# Patient Record
Sex: Female | Born: 1990 | Hispanic: Yes | Marital: Single | Smoking: Never smoker
Health system: Southern US, Community
[De-identification: ages and names within clinical notes are randomized; demographics above are authoritative.]

---

## 2017-08-18 ENCOUNTER — Emergency Department (HOSPITAL_COMMUNITY)
Admission: EM | Admit: 2017-08-18 | Discharge: 2017-08-18 | Disposition: A | Discharge status: Home / Self Care | Payer: 59 | Attending: Emergency Medicine | Admitting: Emergency Medicine

## 2017-08-18 ENCOUNTER — Other Ambulatory Visit: Payer: Self-pay

## 2017-08-18 ENCOUNTER — Emergency Department (HOSPITAL_COMMUNITY): Payer: 59

## 2017-08-18 ENCOUNTER — Encounter (HOSPITAL_COMMUNITY): Payer: Self-pay

## 2017-08-18 DIAGNOSIS — J189 Pneumonia, unspecified organism: Secondary | ICD-10-CM

## 2017-08-18 DIAGNOSIS — I10 Essential (primary) hypertension: Secondary | ICD-10-CM | POA: Insufficient documentation

## 2017-08-18 DIAGNOSIS — R911 Solitary pulmonary nodule: Secondary | ICD-10-CM

## 2017-08-18 DIAGNOSIS — R1011 Right upper quadrant pain: Secondary | ICD-10-CM | POA: Diagnosis present

## 2017-08-18 LAB — CBC WITH DIFFERENTIAL/PLATELET
ABS IMMATURE GRANULOCYTES: 0 10*3/uL (ref 0.0–0.1)
BASOS ABS: 0 10*3/uL (ref 0.0–0.1)
BASOS PCT: 0 %
Eosinophils Absolute: 0.1 10*3/uL (ref 0.0–0.7)
Eosinophils Relative: 1 %
HCT: 34.2 % — ABNORMAL LOW (ref 36.0–46.0)
HEMOGLOBIN: 11.2 g/dL — AB (ref 12.0–15.0)
Immature Granulocytes: 0 %
LYMPHS PCT: 15 %
Lymphs Abs: 1.6 10*3/uL (ref 0.7–4.0)
MCH: 29.3 pg (ref 26.0–34.0)
MCHC: 32.7 g/dL (ref 30.0–36.0)
MCV: 89.5 fL (ref 78.0–100.0)
MONO ABS: 0.9 10*3/uL (ref 0.1–1.0)
MONOS PCT: 8 %
Neutro Abs: 8.1 10*3/uL — ABNORMAL HIGH (ref 1.7–7.7)
Neutrophils Relative %: 76 %
Platelets: 201 10*3/uL (ref 150–400)
RBC: 3.82 MIL/uL — ABNORMAL LOW (ref 3.87–5.11)
RDW: 11.8 % (ref 11.5–15.5)
WBC: 10.8 10*3/uL — ABNORMAL HIGH (ref 4.0–10.5)

## 2017-08-18 LAB — COMPREHENSIVE METABOLIC PANEL
ALT: 32 U/L (ref 14–54)
AST: 29 U/L (ref 15–41)
Albumin: 3.4 g/dL — ABNORMAL LOW (ref 3.5–5.0)
Alkaline Phosphatase: 73 U/L (ref 38–126)
Anion gap: 6 (ref 5–15)
BILIRUBIN TOTAL: 0.8 mg/dL (ref 0.3–1.2)
BUN: 38 mg/dL — AB (ref 6–20)
CO2: 25 mmol/L (ref 22–32)
Calcium: 8.9 mg/dL (ref 8.9–10.3)
Chloride: 109 mmol/L (ref 101–111)
Creatinine, Ser: 1.21 mg/dL — ABNORMAL HIGH (ref 0.44–1.00)
GFR calc Af Amer: >60 mL/min (ref >60)
GLUCOSE: 98 mg/dL (ref 65–99)
Potassium: 4.8 mmol/L (ref 3.5–5.1)
Sodium: 140 mmol/L (ref 135–145)
TOTAL PROTEIN: 7 g/dL (ref 6.5–8.1)

## 2017-08-18 LAB — LIPASE, BLOOD: LIPASE: 28 U/L (ref 11–51)

## 2017-08-18 LAB — POC URINE PREG, ED: Preg Test, Ur: NEGATIVE

## 2017-08-18 LAB — D-DIMER, QUANTITATIVE (NOT AT ARMC): D DIMER QUANT: 2.36 ug{FEU}/mL — AB (ref 0.00–0.50)

## 2017-08-18 MED ORDER — IOPAMIDOL (ISOVUE-370) INJECTION 76%
100.0000 mL | Freq: Once | INTRAVENOUS | Status: AC
Start: 2017-08-18 — End: 2017-08-18
  Administered 2017-08-18: 100 mL via INTRAVENOUS

## 2017-08-18 MED ORDER — IOPAMIDOL (ISOVUE-370) INJECTION 76%
INTRAVENOUS | Status: AC
  Filled 2017-08-18: qty 100

## 2017-08-18 MED ORDER — AZITHROMYCIN 250 MG PO TABS: ORAL_TABLET | ORAL | 0 refills | Status: AC

## 2017-08-18 NOTE — Discharge Instructions (Addendum)
Make sure that you are getting plenty of rest, drink a lot of fluids and eating 3 meals each day.  For fever, or pain, use Tylenol 650 mg, every 4 hours.  Stop taking the antibiotic prescription today.  The CT scan showed some pulmonary nodules which will require follow-up with a primary care doctor for a repeat CAT scan in 3 to 6 months.  Your blood pressure was elevated today and you need to see a primary care doctor about it in 2 to 4 weeks.  In the meantime stay on a low salt diet, and follow further dietary instructions included here.  Your creatinine level is slightly elevated possibly related to hypertension, or dehydration.  Make sure that you are drinking at least 1 L of water each day.  To improve your kidney function.  See the doctor of your choice if not better in 3 or 4 days.

## 2017-08-18 NOTE — ED Notes (Signed)
Patient transported to X-ray 

## 2017-08-18 NOTE — ED Notes (Signed)
Pt ambulated from waiting room to room. 

## 2017-08-18 NOTE — ED Notes (Signed)
Rt shoulder pain under rt breast pain x 2 days states hurts to takes a deep breath,  Has had a  Stomach ache

## 2017-08-18 NOTE — ED Provider Notes (Signed)
[facility] EMERGENCY DEPARTMENT Provider Note   CSN: [id number] Arrival date & time: 08/18/17  1320     History   Chief Complaint Chief Complaint  Patient presents with  . Shoulder Pain    HPI [facility] Lyfe Reihl is a 27 y.o. female.  HPI  27 year old female presents with chief complaint of right shoulder pain in addition to shortness of breath.  Shoulder pain started yesterday.  Shortness of breath has been for a couple days and seems to go along with the shoulder pain.  The shoulder pain is atraumatic and seems to come and go.  Went away yesterday after treatment and today took ibuprofen and it went away as well.  She currently has no pain.  She has been getting some pain under her right breast as well.  Some on and off headache.  No vomiting.   no leg swelling.  She recently drove to and from [place] about a month ago.  She is not on birth control pills and has never had a DVT.  History reviewed. No pertinent past medical history.  There are no active problems to display for this patient.   History reviewed. No pertinent surgical history.   OB History   None      Home Medications    Prior to Admission medications   Not on File    Family History No family history on file.  Social History Social History   Tobacco Use  . Smoking status: Never Smoker  . Smokeless tobacco: Never Used  Substance Use Topics  . Alcohol use: Yes  . Drug use: Never     Allergies   Patient has no known allergies.   Review of Systems Review of Systems  Constitutional: Negative for fever.  Respiratory: Positive for cough and shortness of breath.   Cardiovascular: Positive for chest pain.  Gastrointestinal: Positive for abdominal pain and diarrhea. Negative for vomiting.  Genitourinary: Negative for dysuria.  Musculoskeletal: Positive for arthralgias.  Neurological: Negative for weakness and numbness.  All other systems reviewed and are  negative.    Physical Exam Updated Vital Signs BP (!) 150/66   Pulse 77   Temp 98.2 F (36.8 C) (Oral)   Resp 20   LMP 07/26/2017   SpO2 99%   Physical Exam  Constitutional: She is oriented to person, place, and time. She appears well-developed and well-nourished. No distress.  HENT:  Head: Normocephalic and atraumatic.  Right Ear: External ear normal.  Left Ear: External ear normal.  Nose: Nose normal.  Eyes: Right eye exhibits no discharge. Left eye exhibits no discharge.  Cardiovascular: Normal rate, regular rhythm and normal heart sounds.  Pulses:      Radial pulses are 2+ on the right side.  Pulmonary/Chest: Effort normal and breath sounds normal. She exhibits no tenderness.  Abdominal: Soft. There is tenderness in the right upper quadrant.  Musculoskeletal:       Right shoulder: She exhibits normal range of motion, no tenderness, no deformity and normal pulse.  Neurological: She is alert and oriented to person, place, and time.  Skin: Skin is warm and dry. She is not diaphoretic.  Nursing note and vitals reviewed.    ED Treatments / Results  Labs (all labs ordered are listed, but only abnormal results are displayed) Labs Reviewed  COMPREHENSIVE METABOLIC PANEL - Abnormal; Notable for the following components:      Result Value   BUN 38 (*)    Creatinine, Ser 1.21 (*)  Albumin 3.4 (*)    All other components within normal limits  CBC WITH DIFFERENTIAL/PLATELET - Abnormal; Notable for the following components:   WBC 10.8 (*)    RBC 3.82 (*)    Hemoglobin 11.2 (*)    HCT 34.2 (*)    Neutro Abs 8.1 (*)    All other components within normal limits  D-DIMER, QUANTITATIVE (NOT AT [facility]) - Abnormal; Notable for the following components:   D-Dimer, Quant 2.36 (*)    All other components within normal limits  LIPASE, BLOOD  BRAIN NATRIURETIC PEPTIDE  I-STAT TROPONIN, ED  POC URINE PREG, ED    EKG EKG Interpretation  Date/Time:  Thursday Aug 18 2017  14:47:14 EDT Ventricular Rate:  84 PR Interval:    QRS Duration: 76 QT Interval:  340 QTC Calculation: 402 R Axis:   78 Text Interpretation:  Normal sinus rhythm RSR' in V1 or V2, probably normal variant no acute ST/T changes No old tracing to compare Confirmed by Pricilla Loveless [phone]) on 08/18/2017 2:51:02 PM   Radiology Dg Chest 2 View  Result Date: 08/18/2017 CLINICAL DATA:  Right-sided chest pain and shortness of breath for 1 week EXAM: CHEST - 2 VIEW COMPARISON:  None. FINDINGS: Cardiac shadows within normal limits. The lungs are well aerated bilaterally with small bilateral pleural effusions and mild bibasilar atelectasis left greater than right. No other focal abnormality is seen. IMPRESSION: Small bilateral pleural effusions and bibasilar atelectatic changes. Electronically Signed   By: Alcide Clever M.D.   On: 08/18/2017 15:12    Procedures Procedures (including critical care time)  Medications Ordered in ED Medications - No data to display   Initial Impression / Assessment and Plan / ED Course  I have reviewed the triage vital signs and the nursing notes.  Pertinent labs & imaging results that were available during my care of the patient were reviewed by me and considered in my medical decision making (see chart for details).     Patient being worked up for both the right upper quadrant pain and shortness of breath.  With the pleuritic pain, I have some suspicion of PE so a d-dimer was sent as she is not otherwise hypoxic or with clinical DVT.  This is positive and she will need a CT to rule out or in PE.  Right upper quadrant ultrasound pending.  Care transferred to Dr. Effie Shy with work-up pending.  Final Clinical Impressions(s) / ED Diagnoses   Final diagnoses:  RUQ abdominal pain    ED Discharge Orders    None       Pricilla Loveless, MD 08/18/17 1553

## 2017-08-18 NOTE — ED Notes (Signed)
Patient transported to CT 

## 2017-08-18 NOTE — ED Triage Notes (Signed)
PT reports sharp pain to right shoulder yesterday that made it difficult for her to take a deep breath. Pt states she felt the same pain this morning. Endorses cough and headache x 1 week. She states she feels like she cant lay flat because she feels like she cant breathe. Denies taking birth control or long distance travel.

## 2017-08-18 NOTE — ED Provider Notes (Signed)
1745-evaluation after testing to consider discharge, care received from Dr. Criss Alvine.  At this time patient is alert, cooperative.  Vital signs are reassuring.  Mild persistent hypertension, not currently being treated.  She describes mild nonproductive cough.  No respiratory distress at this time.  Clinical Course as of Aug 18 1757  Thu Aug 18, 2017  1752 Normal except elevated BUN and creatinine.  Comprehensive metabolic panel(!) [EW]  1752 Elevated  D-dimer, quantitative(!) [EW]  1752 Normal except white count 10.8, hemoglobin low 11.2  CBC with Differential(!) [EW]  1752 Bilateral lower lobe infiltrates and small pleural effusions.  Pulmonary nodules present, which will require follow-up CT in 3 to 6 months.  Patient informed of this.  Images reviewed  CT Angio Chest PE W/Cm &/Or Wo Cm [EW]  1752 No acute changes  US Abdomen Limited RUQ [EW]  1752 DG Chest 2 View [EW]  1753 Small right pleural effusion, images reviewed.  DG Chest 2 View [EW]    Clinical Course User Index [EW] Mancel Bale, MD    Patient Vitals for the past 24 hrs:  BP Temp Temp src Pulse Resp SpO2  08/18/17 1715 (!) 152/92 - - 83 (!) 23 97 %  08/18/17 1630 (!) 152/95 - - 85 (!) 24 98 %  08/18/17 1619 - 99.3 F (37.4 C) Oral - - -  08/18/17 1615 (!) 134/91 - - 78 19 98 %  08/18/17 1600 (!) 156/94 - - 86 18 92 %  08/18/17 1545 (!) 147/87 - - 78 19 96 %  08/18/17 1515 (!) 150/66 - - 77 20 99 %  08/18/17 1500 (!) 161/98 - - 80 19 99 %  08/18/17 1327 (!) 152/98 98.2 F (36.8 C) Oral 76 18 100 %     Medical Decision Making: Nonspecific symptoms, with evaluation indicating lower lobe pulmonary infiltrates with effusion, as well as incidental pulmonary nodules, and hypertension.  Doubt hypertensive urgency, ACS, PE or sepsis.  CRITICAL CARE-no Performed by: Mancel Bale  Nursing Notes Reviewed/ Care Coordinated Applicable Imaging Reviewed Interpretation of Laboratory Data incorporated into ED  treatment  The patient appears reasonably screened and/or stabilized for discharge and I doubt any other medical condition or other [facility] requiring further screening, evaluation, or treatment in the ED at this time prior to discharge.  Plan: Home Medications-oral antibiotic at discharge, Zithromax, Tylenol for pain/fever; Home Treatments-rest, fluids; return here if the recommended treatment, does not improve the symptoms; Recommended follow up-PCP follow-up for blood pressure check in 2 to 4 weeks, and for reevaluation of pulmonary nodules, in 3- 6 months by CT with ongoing evaluations to follow as indicated on initial screening.    ICD-10-CM   1. Community acquired pneumonia, unspecified laterality J18.9   2. RUQ abdominal pain R10.11 US Abdomen Limited RUQ    US Abdomen Limited RUQ  3. Hypertension, unspecified type I10   4. Pulmonary nodule R91.1       Mancel Bale, MD 08/19/17 1436

## 2019-06-07 IMAGING — CR DG CHEST 2V
2 series · 2 of 2 positions shown · non-contrast
Comparison: None.

CLINICAL DATA: Right-sided chest pain and shortness of breath for 1
week

EXAM:
CHEST - 2 VIEW

[chest pa]
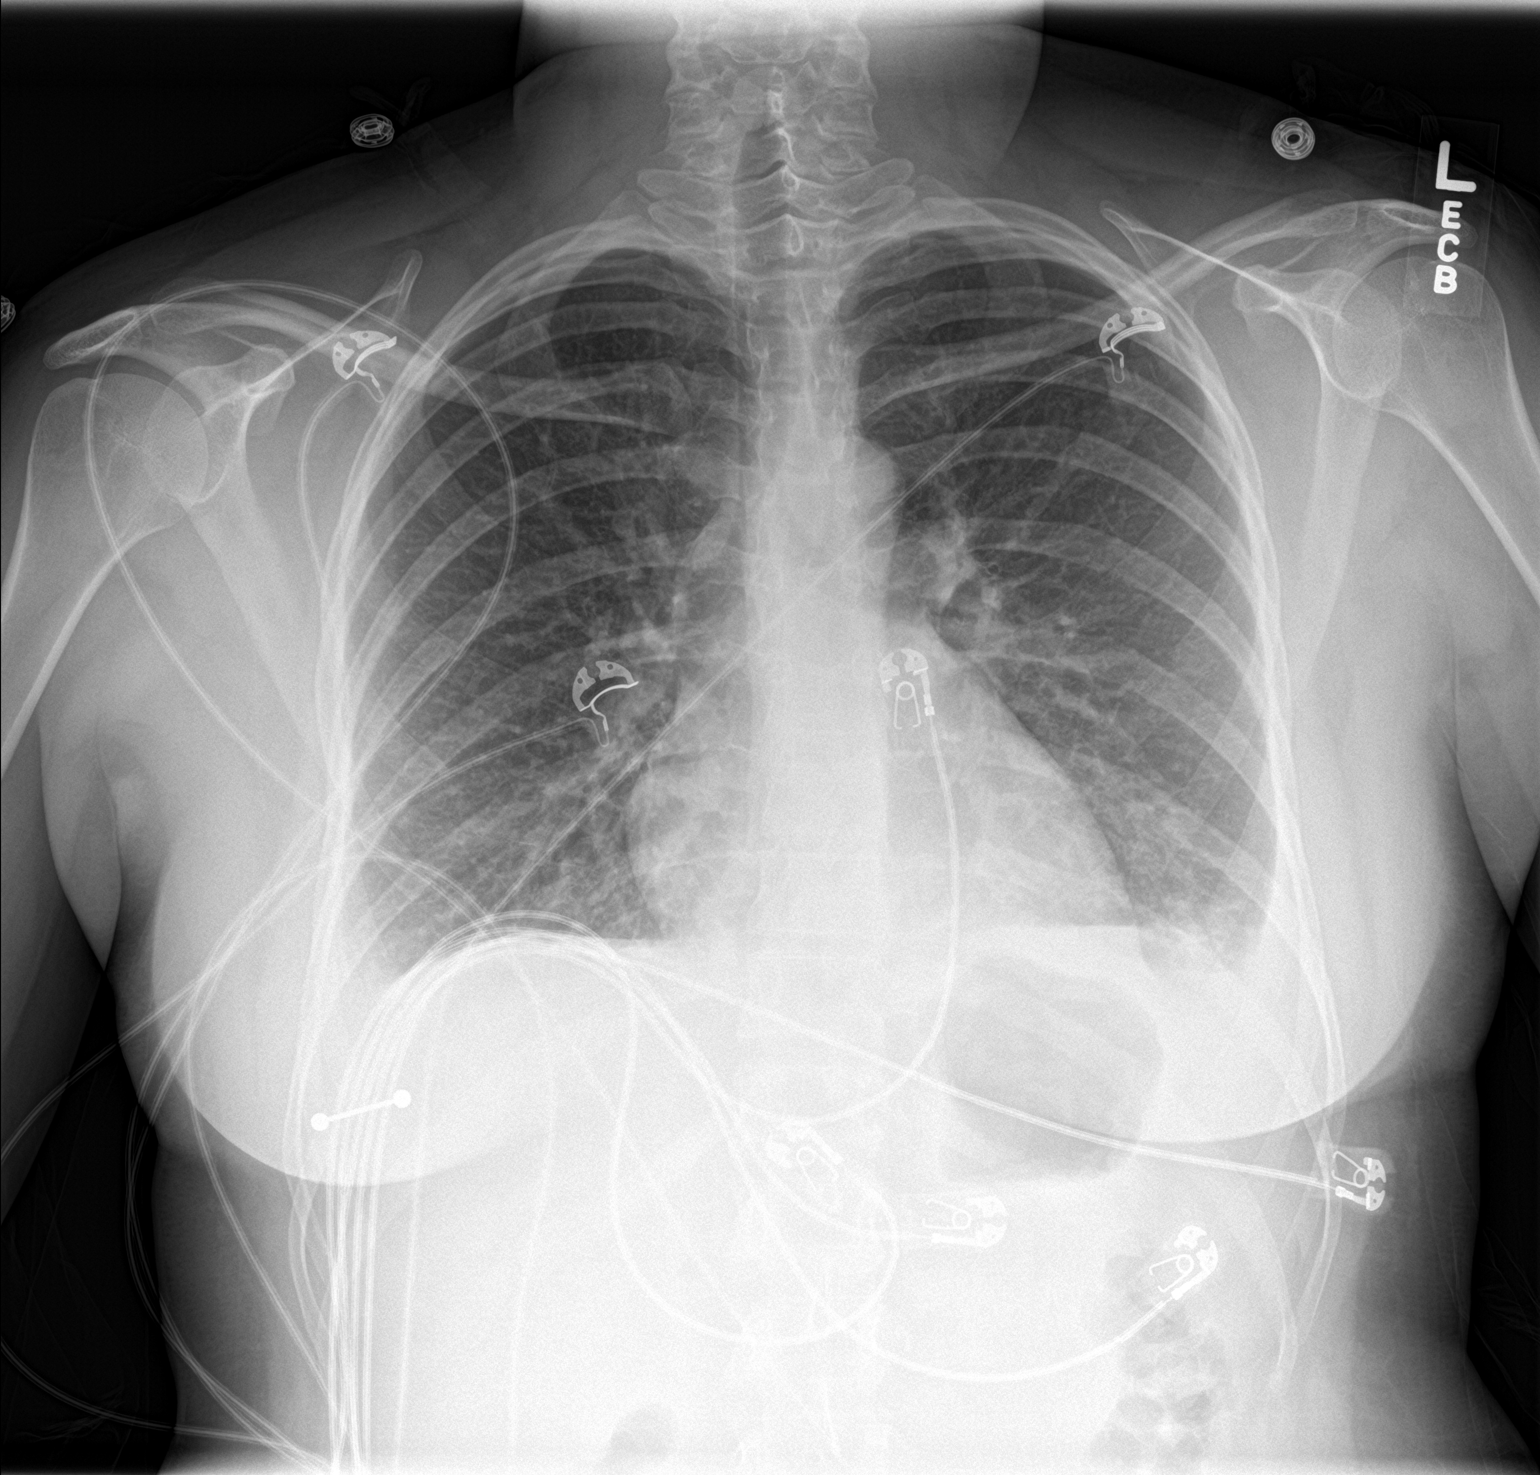

[chest lat]
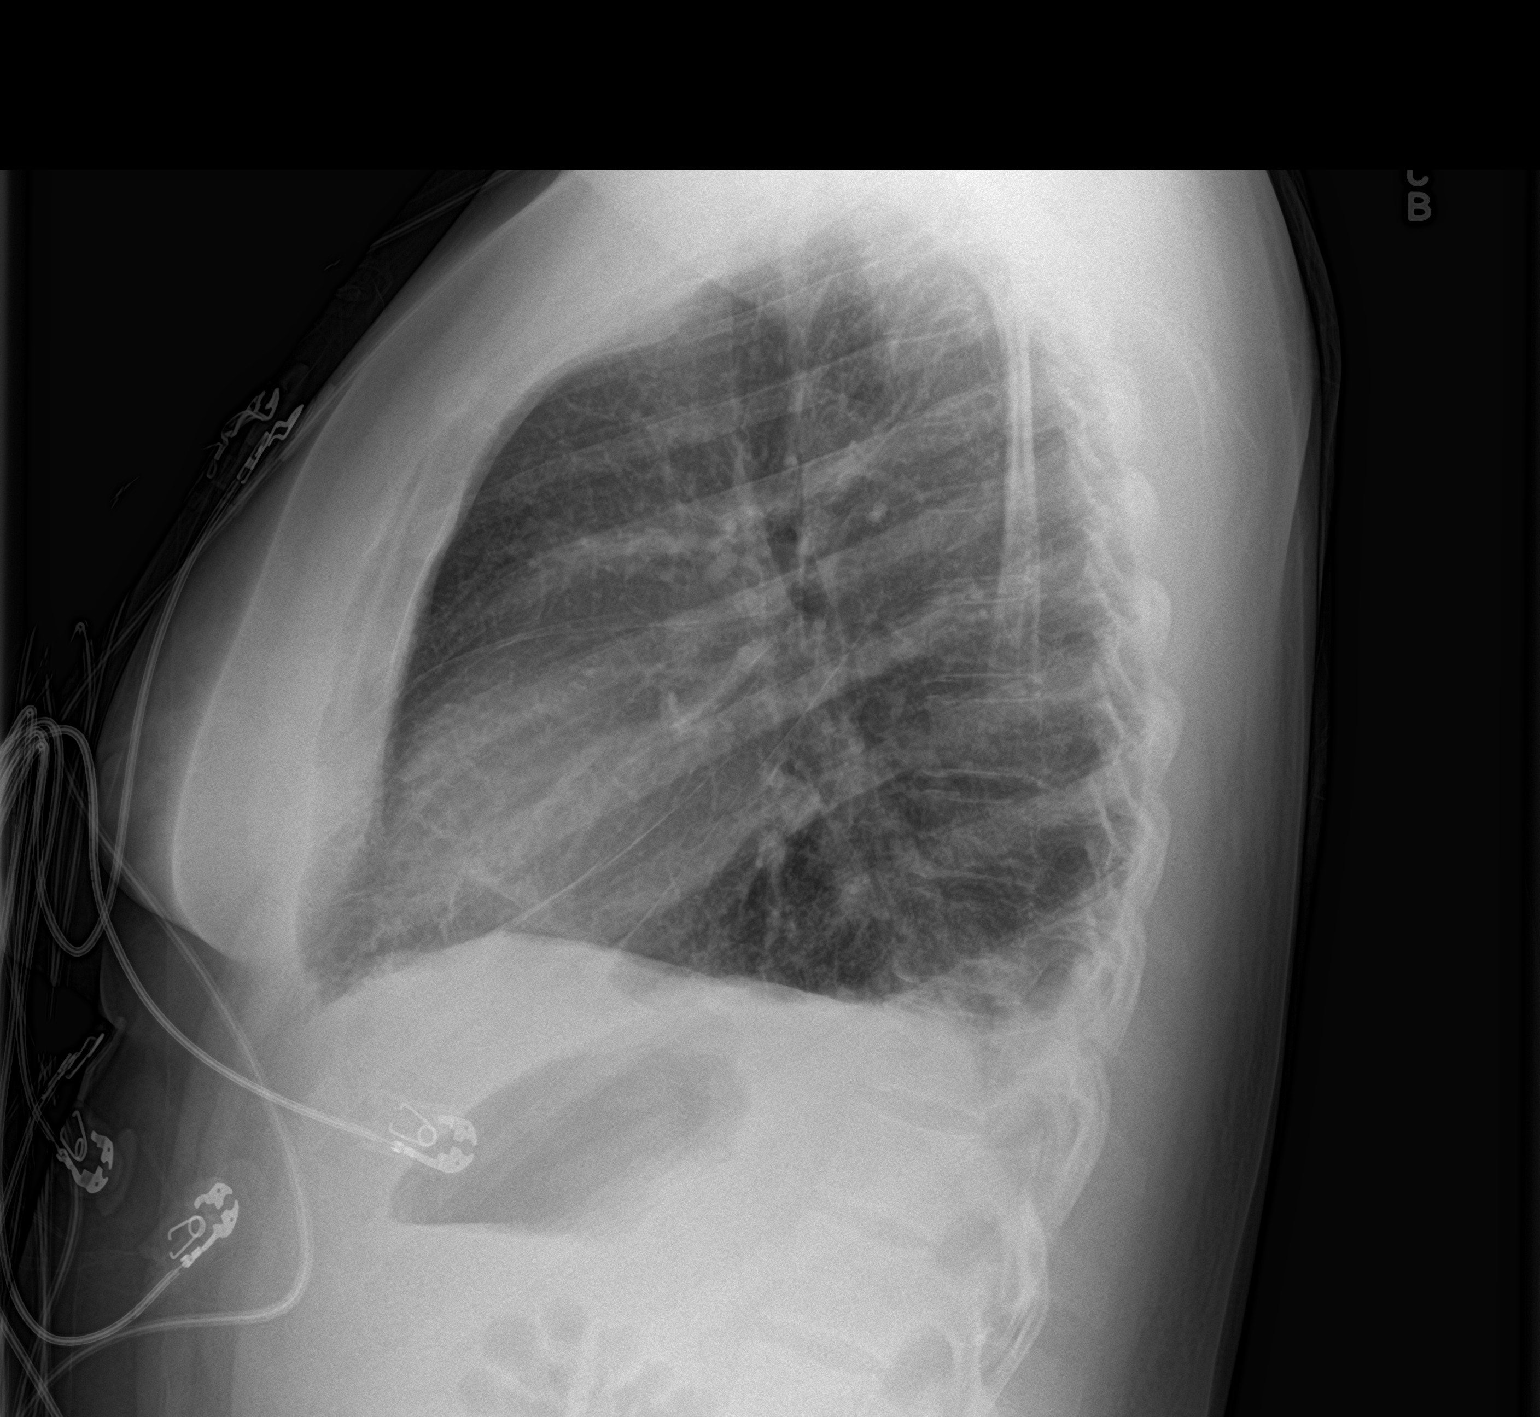

[2 of 2 positions shown; findings below may reference images not displayed]

FINDINGS: Cardiac shadows within normal limits. The lungs are well aerated
bilaterally with small bilateral pleural effusions and mild
bibasilar atelectasis left greater than right. No other focal
abnormality is seen.
IMPRESSION: Small bilateral pleural effusions and bibasilar atelectatic changes.
